# Patient Record
Sex: Female | Born: 1970 | Race: White | Hispanic: No | Smoking: Current every day smoker
Health system: Southern US, Community
[De-identification: ages and names within clinical notes are randomized; demographics above are authoritative.]

## PROBLEM LIST (undated history)

## (undated) HISTORY — PX: CHOLECYSTECTOMY: SHX55

---

## 2008-02-22 ENCOUNTER — Emergency Department: Payer: Self-pay | Admitting: Internal Medicine

## 2008-02-27 ENCOUNTER — Emergency Department: Payer: Self-pay | Admitting: Internal Medicine

## 2012-04-09 ENCOUNTER — Ambulatory Visit: Payer: Self-pay | Admitting: Internal Medicine

## 2012-12-12 ENCOUNTER — Observation Stay: Payer: Self-pay | Admitting: Obstetrics & Gynecology

## 2012-12-12 LAB — URINALYSIS, COMPLETE
Bilirubin,UR: NEGATIVE
Blood: NEGATIVE
Glucose,UR: 50 mg/dL (ref 0–75)
Nitrite: NEGATIVE
Protein: 30
Specific Gravity: 1.024 (ref 1.003–1.030)
Squamous Epithelial: NONE SEEN
WBC UR: 1 /HPF (ref 0–5)

## 2013-01-17 ENCOUNTER — Ambulatory Visit: Payer: Self-pay | Admitting: Obstetrics and Gynecology

## 2013-01-17 LAB — CBC WITH DIFFERENTIAL/PLATELET
Basophil #: 0 10*3/uL (ref 0.0–0.1)
Eosinophil #: 0 10*3/uL (ref 0.0–0.7)
HGB: 12.1 g/dL (ref 12.0–16.0)
Lymphocyte #: 2.4 10*3/uL (ref 1.0–3.6)
Lymphocyte %: 16 %
MCH: 28 pg (ref 26.0–34.0)
MCV: 82 fL (ref 80–100)
Monocyte #: 0.8 x10 3/mm (ref 0.2–0.9)
Monocyte %: 5.3 %
Neutrophil %: 78.1 %
Platelet: 230 10*3/uL (ref 150–440)
WBC: 15.1 10*3/uL — ABNORMAL HIGH (ref 3.6–11.0)

## 2013-01-18 ENCOUNTER — Inpatient Hospital Stay: Payer: Self-pay | Admitting: Obstetrics and Gynecology

## 2013-08-19 ENCOUNTER — Emergency Department: Payer: Self-pay | Admitting: Emergency Medicine

## 2013-10-26 ENCOUNTER — Ambulatory Visit: Payer: Self-pay | Admitting: Family Medicine

## 2014-07-10 ENCOUNTER — Emergency Department (HOSPITAL_COMMUNITY)
Admission: EM | Admit: 2014-07-10 | Discharge: 2014-07-10 | Disposition: A | Discharge status: Home / Self Care | Payer: Medicaid Other | Attending: Emergency Medicine | Admitting: Emergency Medicine

## 2014-07-10 ENCOUNTER — Emergency Department (HOSPITAL_COMMUNITY): Payer: Medicaid Other

## 2014-07-10 ENCOUNTER — Encounter (HOSPITAL_COMMUNITY): Payer: Self-pay | Admitting: *Deleted

## 2014-07-10 DIAGNOSIS — Z79899 Other long term (current) drug therapy: Secondary | ICD-10-CM | POA: Insufficient documentation

## 2014-07-10 DIAGNOSIS — N201 Calculus of ureter: Secondary | ICD-10-CM | POA: Insufficient documentation

## 2014-07-10 DIAGNOSIS — R109 Unspecified abdominal pain: Secondary | ICD-10-CM | POA: Diagnosis present

## 2014-07-10 DIAGNOSIS — Z720 Tobacco use: Secondary | ICD-10-CM | POA: Insufficient documentation

## 2014-07-10 DIAGNOSIS — Z3202 Encounter for pregnancy test, result negative: Secondary | ICD-10-CM | POA: Insufficient documentation

## 2014-07-10 LAB — CBC WITH DIFFERENTIAL/PLATELET
BASOS PCT: 0 % (ref 0–1)
Basophils Absolute: 0 10*3/uL (ref 0.0–0.1)
EOS PCT: 0 % (ref 0–5)
Eosinophils Absolute: 0 10*3/uL (ref 0.0–0.7)
HEMATOCRIT: 42.4 % (ref 36.0–46.0)
HEMOGLOBIN: 14.6 g/dL (ref 12.0–15.0)
Lymphocytes Relative: 22 % (ref 12–46)
Lymphs Abs: 2.5 10*3/uL (ref 0.7–4.0)
MCH: 26.7 pg (ref 26.0–34.0)
MCHC: 34.4 g/dL (ref 30.0–36.0)
MCV: 77.5 fL — AB (ref 78.0–100.0)
MONO ABS: 0.9 10*3/uL (ref 0.1–1.0)
MONOS PCT: 8 % (ref 3–12)
NEUTROS ABS: 8 10*3/uL — AB (ref 1.7–7.7)
Neutrophils Relative %: 70 % (ref 43–77)
Platelets: 285 10*3/uL (ref 150–400)
RBC: 5.47 MIL/uL — ABNORMAL HIGH (ref 3.87–5.11)
RDW: 14.3 % (ref 11.5–15.5)
WBC: 11.4 10*3/uL — ABNORMAL HIGH (ref 4.0–10.5)

## 2014-07-10 LAB — COMPREHENSIVE METABOLIC PANEL
ALBUMIN: 3.8 g/dL (ref 3.5–5.2)
ALT: 18 U/L (ref 0–35)
AST: 15 U/L (ref 0–37)
Alkaline Phosphatase: 166 U/L — ABNORMAL HIGH (ref 39–117)
Anion gap: 12 (ref 5–15)
BILIRUBIN TOTAL: 0.4 mg/dL (ref 0.3–1.2)
BUN: 14 mg/dL (ref 6–23)
CALCIUM: 10.2 mg/dL (ref 8.4–10.5)
CO2: 26 mEq/L (ref 19–32)
CREATININE: 0.68 mg/dL (ref 0.50–1.10)
Chloride: 101 mEq/L (ref 96–112)
GFR calc Af Amer: >90 mL/min (ref >90)
GFR calc non Af Amer: >90 mL/min (ref >90)
Glucose, Bld: 114 mg/dL — ABNORMAL HIGH (ref 70–99)
Potassium: 4.3 mEq/L (ref 3.7–5.3)
Sodium: 139 mEq/L (ref 137–147)
Total Protein: 7.4 g/dL (ref 6.0–8.3)

## 2014-07-10 LAB — URINE MICROSCOPIC-ADD ON

## 2014-07-10 LAB — URINALYSIS, ROUTINE W REFLEX MICROSCOPIC
BILIRUBIN URINE: NEGATIVE
GLUCOSE, UA: NEGATIVE mg/dL
KETONES UR: NEGATIVE mg/dL
Nitrite: NEGATIVE
Protein, ur: NEGATIVE mg/dL
Specific Gravity, Urine: 1.017 (ref 1.005–1.030)
UROBILINOGEN UA: 0.2 mg/dL (ref 0.0–1.0)
pH: 6 (ref 5.0–8.0)

## 2014-07-10 LAB — PREGNANCY, URINE: PREG TEST UR: NEGATIVE

## 2014-07-10 MED ORDER — MORPHINE SULFATE 4 MG/ML IJ SOLN
4.0000 mg | Freq: Once | INTRAMUSCULAR | Status: AC
  Administered 2014-07-10: 4 mg via INTRAVENOUS
  Filled 2014-07-10: qty 1

## 2014-07-10 MED ORDER — OXYCODONE-ACETAMINOPHEN 5-325 MG PO TABS: 1.0000 | ORAL_TABLET | Freq: Four times a day (QID) | ORAL | Status: DC | PRN

## 2014-07-10 MED ORDER — KETOROLAC TROMETHAMINE 30 MG/ML IJ SOLN
30.0000 mg | Freq: Once | INTRAMUSCULAR | Status: AC
  Administered 2014-07-10: 30 mg via INTRAVENOUS
  Filled 2014-07-10: qty 1

## 2014-07-10 MED ORDER — ONDANSETRON HCL 4 MG PO TABS: 4.0000 mg | ORAL_TABLET | Freq: Four times a day (QID) | ORAL | Status: DC

## 2014-07-10 NOTE — Discharge Instructions (Signed)

## 2014-07-10 NOTE — ED Notes (Signed)
Patient transported to CT 
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
## 2014-07-10 NOTE — ED Notes (Signed)
[facility] Expected date:  Expected time:  Means of arrival:  Comments: Ems- 43 yo F, flank pain

## 2014-07-10 NOTE — ED Provider Notes (Signed)
CSN: [id number]     Arrival date & time 07/10/14  1350 History   First MD Initiated Contact with Patient 07/10/14 1457     Chief Complaint  Patient presents with  . Flank Pain  . Abdominal Pain     (Consider location/radiation/quality/duration/timing/severity/associated sxs/prior Treatment) Patient is a 43 y.o. female presenting with flank pain. The history is provided by the patient. No language interpreter was used.  Flank Pain This is a new problem. The current episode started 3 to 5 hours ago. The problem occurs constantly. Progression since onset: waxing/waning. Associated symptoms include abdominal pain. Pertinent negatives include no chest pain, no headaches and no shortness of breath. Associated symptoms comments: Pain radiates to LLQ. Exacerbated by: urinating. Nothing relieves the symptoms. She has tried nothing for the symptoms. The treatment provided no relief.    History reviewed. No pertinent past medical history. Past Surgical History  Procedure Laterality Date  . Cesarean section    . Cholecystectomy     No family history on file. History  Substance Use Topics  . Smoking status: Current Every Day Smoker  . Smokeless tobacco: Never Used  . Alcohol Use: No   OB History    No data available     Review of Systems  Constitutional: Negative for fever, chills, diaphoresis, activity change, appetite change and fatigue.  HENT: Negative for congestion, facial swelling, rhinorrhea and sore throat.   Eyes: Negative for photophobia and discharge.  Respiratory: Negative for cough, chest tightness and shortness of breath.   Cardiovascular: Negative for chest pain, palpitations and leg swelling.  Gastrointestinal: Positive for abdominal pain. Negative for nausea, vomiting and diarrhea.  Endocrine: Negative for polydipsia and polyuria.  Genitourinary: Positive for flank pain. Negative for dysuria, frequency, difficulty urinating and pelvic pain.  Musculoskeletal: Negative for  back pain, arthralgias, neck pain and neck stiffness.  Skin: Negative for color change and wound.  Allergic/Immunologic: Negative for immunocompromised state.  Neurological: Negative for facial asymmetry, weakness, numbness and headaches.  Hematological: Does not bruise/bleed easily.  Psychiatric/Behavioral: Negative for confusion and agitation.      Allergies  Review of patient's allergies indicates no known allergies.  Home Medications   Prior to Admission medications   Medication Sig Start Date End Date Taking? Authorizing Provider  Ibuprofen (ADVIL) 200 MG CAPS Take 400 mg by mouth every 4 (four) hours as needed (pain.).   Yes Historical Provider, MD  propylthiouracil (PTU) 50 MG tablet Take 100 mg by mouth 3 (three) times daily.   Yes Historical Provider, MD  ondansetron (ZOFRAN) 4 MG tablet Take 1 tablet (4 mg total) by mouth every 6 (six) hours. 07/10/14   Toy Cookey, MD  oxyCODONE-acetaminophen (PERCOCET) 5-325 MG per tablet Take 1-2 tablets by mouth every 6 (six) hours as needed. 07/10/14   Toy Cookey, MD   BP 114/72 mmHg  Pulse 76  Temp(Src) 98.1 F (36.7 C) (Oral)  Resp 18  SpO2 100% Physical Exam  Constitutional: She is oriented to person, place, and time. She appears well-developed and well-nourished. No distress.  HENT:  Head: Normocephalic and atraumatic.  Mouth/Throat: No oropharyngeal exudate.  Eyes: Pupils are equal, round, and reactive to light.  Neck: Normal range of motion. Neck supple.  Cardiovascular: Normal rate, regular rhythm and normal heart sounds.  Exam reveals no gallop and no friction rub.   No murmur heard. Pulmonary/Chest: Effort normal and breath sounds normal. No respiratory distress. She has no wheezes. She has no rales.  Abdominal: Soft. Bowel sounds  are normal. She exhibits no distension and no mass. There is tenderness in the left lower quadrant. There is CVA tenderness (left). There is no rigidity, no rebound and no guarding.   Musculoskeletal: Normal range of motion. She exhibits no edema or tenderness.  Neurological: She is alert and oriented to person, place, and time.  Skin: Skin is warm and dry.  Psychiatric: She has a normal mood and affect.    ED Course  Procedures (including critical care time) Labs Review Labs Reviewed  URINALYSIS, ROUTINE W REFLEX MICROSCOPIC - Abnormal; Notable for the following:    Hgb urine dipstick LARGE (*)    Leukocytes, UA SMALL (*)    All other components within normal limits  CBC WITH DIFFERENTIAL - Abnormal; Notable for the following:    WBC 11.4 (*)    RBC 5.47 (*)    MCV 77.5 (*)    Neutro Abs 8.0 (*)    All other components within normal limits  COMPREHENSIVE METABOLIC PANEL - Abnormal; Notable for the following:    Glucose, Bld 114 (*)    Alkaline Phosphatase 166 (*)    All other components within normal limits  URINE MICROSCOPIC-ADD ON - Abnormal; Notable for the following:    Bacteria, UA FEW (*)    All other components within normal limits  PREGNANCY, URINE    Imaging Review Ct Renal Stone Study  07/10/2014   CLINICAL DATA:  Left flank pain today  EXAM: CT ABDOMEN AND PELVIS WITHOUT CONTRAST  TECHNIQUE: Multidetector CT imaging of the abdomen and pelvis was performed following the standard protocol without IV contrast.  COMPARISON:  None.  FINDINGS: Sagittal images of the spine shows no destructive bony lesions. 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 Lung bases are unremarkable. Status postcholecystectomy.  Unenhanced liver shows no biliary ductal dilatation. Mild atherosclerotic calcifications of abdominal aorta. No aortic aneurysm. Unenhanced pancreas, spleen and adrenal glands are unremarkable. No nephrolithiasis. No proximal or mid ureteral calcified calculi. There is mild left hydronephrosis and minimal left hydroureter.  In axial image 70 there is 4 mm calcified calculus in left UVJ/urinary bladder wall. Distal right ureter is unremarkable. Unenhanced  uterus and adnexa are unremarkable. Normal retrocecal appendix. No pericecal inflammation.  No small bowel obstruction.  No ascites or free air.  No adenopathy.  IMPRESSION: 1. No nephrolithiasis. There is mild left hydronephrosis and minimal left hydroureter. 2. There is 4 mm calcified calculus in left UVJ/urinary bladder wall. 3. Normal retrocecal appendix. 4. Status postcholecystectomy. 5. 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   Electronically Signed   By: Natasha MeadLiviu  Pop M.D.   On: 07/10/2014 15:54     EKG Interpretation None      MDM   Final diagnoses:  Left flank pain  Left ureteral stone    Pt is a 43 y.o. female with Pmhx as above who presents with sudden onset, sharp left flank pain radiating to left lower quadrant, waxing and waning since around noon and worse with urination.  She reports nausea and vomiting times one.  No fevers or chills.  She had a normal bowel movement this morning.  No history of kidney stones.  On physical exam, vital signs are stable.  Patient is in no acute distress.  Positive CVA tenderness on the left and positive left lower quadrant tenderness to palpation without rebound or guarding.  Labs show mild leukocytosis.  UA with large blood and small leukocytes.  Point-of-care Pred negative.  CT  stone study ordered  Was positive for 4 mm left-sided UVJ stone with mild hydronephrosis. Patient given 1 dose of pain medicine, felt much improved. Urine does not appear infected.  Creatinine is stable.  I feel she is safe to be discharged with pain medication, antiemetics and outpatient follow-up with urology.     Nicoletta DressAngela Mae Zukowski evaluation in the Emergency Department is complete. It has been determined that no acute conditions requiring further emergency intervention are present at this time. The patient/guardian have been advised of the diagnosis and plan. We have discussed signs and symptoms that warrant return to the ED, such as changes or worsening in symptoms,   Worsening pain, fever, inability to tolerate liquids      Toy CookeyMegan Docherty, MD 07/11/14 [phone]

## 2014-08-06 ENCOUNTER — Ambulatory Visit: Payer: Self-pay

## 2014-11-22 NOTE — Op Note (Signed)
PATIENT NAME:  Melissa Shaw, Melissa Shaw MR#:  [id number] DATE OF BIRTH:  [birth year]  DATE OF PROCEDURE:  01/18/2013  PREOPERATIVE DIAGNOSES:  1.  Intrauterine pregnancy at [redacted] weeks gestational age.  2.  History of prior cesarean section x2, desires repeat.   POSTOPERATIVE DIAGNOSES: 1.  Intrauterine pregnancy at [redacted] weeks gestational age.  2.  History of prior cesarean section x2, desires repeat.   PROCEDURE: Low transverse cesarean section, repeat, via Pfannenstiel incision.   ANESTHESIA: Spinal.   SURGEON: Prentice Docker, M.D.   ASSISTANT SURGEON: Barnett Applebaum, M.D.   ESTIMATED BLOOD LOSS: 750 mL.  OPERATIVE FLUIDS: 3000 mL crystalloid.   COMPLICATIONS: None.   FINDINGS:  1.  Normal-appearing gravid uterus, fallopian tubes and ovaries.  2.  Adherence of anterior uterine wall to the peritoneum.   SPECIMENS: None.   CONDITION: Stable at the end of procedure.   PROCEDURE IN DETAIL: The patient was met in the preoperative area and surgery was reviewed and questions were answered. She was taken to the operating room where spinal anesthesia was administered and found to be adequate. She was placed in the dorsal supine position with leftward tilt and prepped and draped in the usual sterile fashion. After timeout was called, a Pfannenstiel incision was made and carried through the various layers until the peritoneum was identified and entered sharply. The peritoneum was extended in the cranial and caudal directions. A bladder flap was created and the bladder retractor was placed to move the bladder out of the operative area of interest. A low transverse hysterotomy was made with the scalpel and extended laterally with cranial and caudal tension. The fetal vertex was grasped and elevated to the hysterotomy and with fundal pressure the head followed by the shoulders and the rest of the body were delivered without difficulty. The cord was clamped and cut and handed to the pediatrician. Cord blood was  collected. The placenta was removed and the uterus was exteriorized and cleared of all clots and debris. The hysterotomy was closed using #0 Vicryl in a running locked fashion. A second layer of the same suture was used to obtain hemostasis in an imbricating fashion. The uterus was returned to the abdomen and the gutters were cleared of all clots and debris. The peritoneum was loosely reapproximated using #0 Vicryl.   After verifying that there was hemostasis on the rectus abdominis muscles, the On-Q catheters were placed according to the manufacturer's recommendations. They were placed approximately 4 cm cephalad to the incision line, approximately 1 cm apart straddling the midline. They were inserted to the level of approximately the third mark on the catheter and positioned just superficial to the rectus abdominis muscles and just deep to the rectus fascia.   The fascia was closed using #0 Maxon using 2 sutures, each starting beneath the apices and meeting in the midline where they were tied together. The skin was closed with staples.   The On-Q catheters were affixed to the skin using Dermabond followed by Steri-Strips and Tegaderm. Each catheter was bolused with 5 mL of 0.5% Marcaine plain for a total of 10 mL.   The patient tolerated the procedure well. Sponge, lap, and needle counts were correct x2. For VTE prophylaxis, the patient was wearing SCDs, which were in place and operating throughout the entire procedure. For antibiotic prophylaxis, the patient received 2 grams of Ancef prior to skin incision. She was taken to the recovery area in stable condition.   ____________________________ Will Bonnet, MD sdj:aw D:  01/18/2013 08:44:30 ET T: 01/18/2013 08:54:56 ET JOB#: [id number]  cc: Will Bonnet, MD, <Dictator> Will Bonnet MD ELECTRONICALLY SIGNED 02/27/2013 12:21

## 2017-09-26 ENCOUNTER — Encounter: Payer: Self-pay | Admitting: General Surgery

## 2021-01-27 ENCOUNTER — Emergency Department: Payer: Medicaid Other

## 2021-01-27 ENCOUNTER — Emergency Department
Admission: EM | Admit: 2021-01-27 | Discharge: 2021-01-27 | Disposition: A | Discharge status: Home / Self Care | Payer: Medicaid Other | Attending: Emergency Medicine | Admitting: Emergency Medicine

## 2021-01-27 ENCOUNTER — Other Ambulatory Visit: Payer: Self-pay

## 2021-01-27 DIAGNOSIS — M549 Dorsalgia, unspecified: Secondary | ICD-10-CM | POA: Insufficient documentation

## 2021-01-27 DIAGNOSIS — M7918 Myalgia, other site: Secondary | ICD-10-CM

## 2021-01-27 DIAGNOSIS — R0781 Pleurodynia: Secondary | ICD-10-CM | POA: Insufficient documentation

## 2021-01-27 DIAGNOSIS — F17200 Nicotine dependence, unspecified, uncomplicated: Secondary | ICD-10-CM | POA: Insufficient documentation

## 2021-01-27 MED ORDER — KETOROLAC TROMETHAMINE 30 MG/ML IJ SOLN
30.0000 mg | Freq: Once | INTRAMUSCULAR | Status: AC
  Administered 2021-01-27: 30 mg via INTRAMUSCULAR
  Filled 2021-01-27: qty 1

## 2021-01-27 MED ORDER — CYCLOBENZAPRINE HCL 10 MG PO TABS
10.0000 mg | ORAL_TABLET | Freq: Once | ORAL | Status: DC
  Filled 2021-01-27: qty 1

## 2021-01-27 MED ORDER — TIZANIDINE HCL 4 MG PO TABS: 4.0000 mg | ORAL_TABLET | Freq: Three times a day (TID) | ORAL | 0 refills | Status: AC

## 2021-01-27 MED ORDER — KETOROLAC TROMETHAMINE 10 MG PO TABS: 10.0000 mg | ORAL_TABLET | Freq: Four times a day (QID) | ORAL | 0 refills | Status: AC | PRN
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
## 2021-01-27 NOTE — Discharge Instructions (Addendum)
No evidence of fracture on the x-rays.   Take the medications as prescribed, but if you are driving/working do not take the muscle realxer.

## 2022-03-06 IMAGING — CR DG CHEST 2V
2 series · 2 of 2 positions shown · non-contrast
Comparison: None.

CLINICAL DATA: Chest pain

EXAM:
CHEST - 2 VIEW

[chest pa]
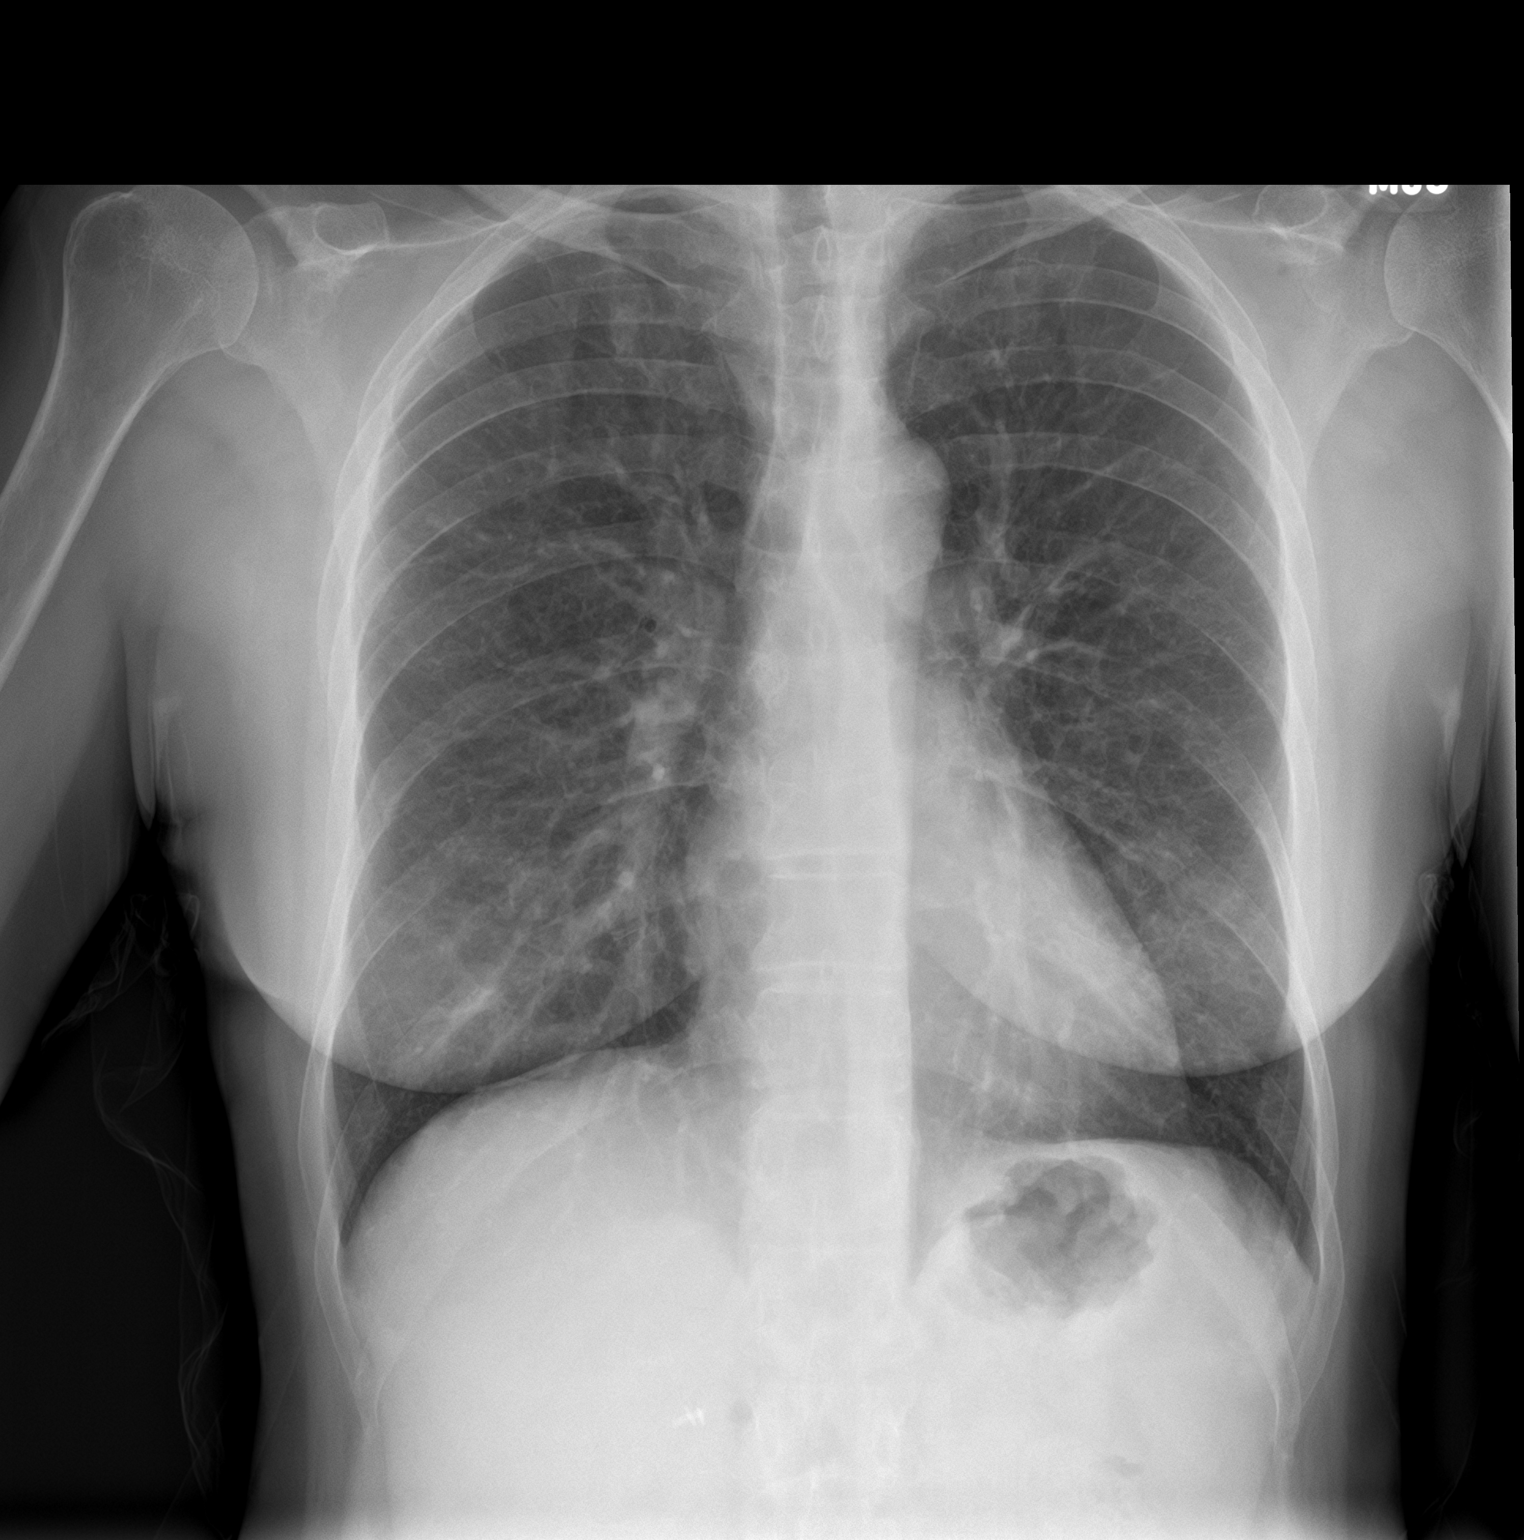

[chest lat]
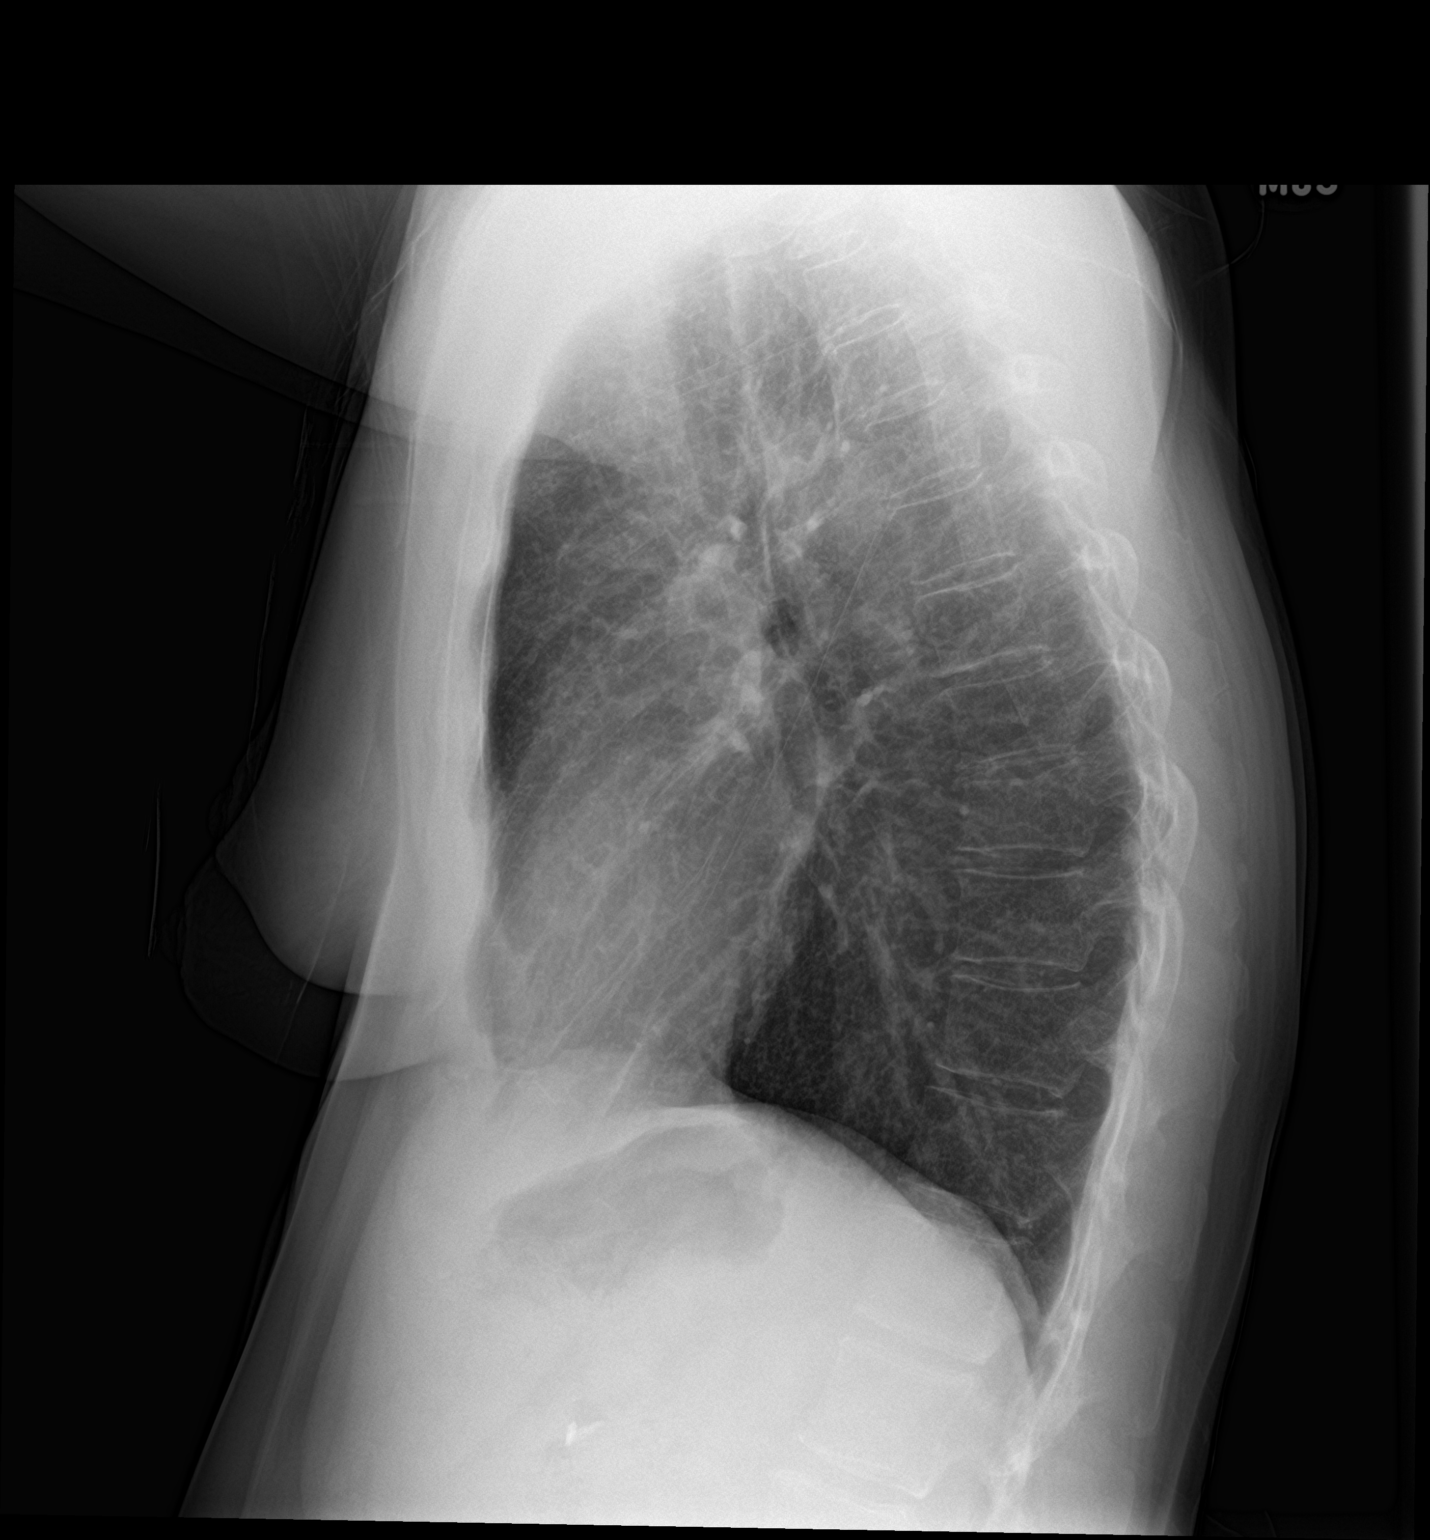

[2 of 2 positions shown; findings below may reference images not displayed]

FINDINGS: No focal opacity or pleural effusion. Normal cardiomediastinal
silhouette. No pneumothorax. Age indeterminate minimal superior
endplate deformity at approximate T9 level.
IMPRESSION: No active cardiopulmonary disease. Age indeterminate minimal
superior endplate deformity at approximate T9 level.
